# Patient Record
Sex: Male | Born: 1980 | Race: White | Hispanic: No | Marital: Single | Smoking: Current every day smoker
Health system: Southern US, Community
[De-identification: ages and names within clinical notes are randomized; demographics above are authoritative.]

## PROBLEM LIST (undated history)

## (undated) DIAGNOSIS — S069XAA Unspecified intracranial injury with loss of consciousness status unknown, initial encounter: Secondary | ICD-10-CM

## (undated) DIAGNOSIS — S069X9A Unspecified intracranial injury with loss of consciousness of unspecified duration, initial encounter: Secondary | ICD-10-CM

## (undated) DIAGNOSIS — R569 Unspecified convulsions: Secondary | ICD-10-CM

---

## 2011-03-09 ENCOUNTER — Encounter: Payer: Self-pay | Admitting: Student

## 2011-03-09 ENCOUNTER — Emergency Department (HOSPITAL_BASED_OUTPATIENT_CLINIC_OR_DEPARTMENT_OTHER)
Admission: EM | Admit: 2011-03-09 | Discharge: 2011-03-09 | Disposition: A | Discharge status: Home / Self Care | Payer: Medicaid Other | Attending: Emergency Medicine | Admitting: Emergency Medicine

## 2011-03-09 DIAGNOSIS — F17200 Nicotine dependence, unspecified, uncomplicated: Secondary | ICD-10-CM | POA: Insufficient documentation

## 2011-03-09 DIAGNOSIS — R569 Unspecified convulsions: Secondary | ICD-10-CM

## 2011-03-09 DIAGNOSIS — G40909 Epilepsy, unspecified, not intractable, without status epilepticus: Secondary | ICD-10-CM | POA: Insufficient documentation

## 2011-03-09 HISTORY — DX: Unspecified convulsions: R56.9

## 2011-03-09 MED ORDER — PHENYTOIN SODIUM EXTENDED 300 MG PO CAPS: 300.0000 mg | ORAL_CAPSULE | Freq: Every day | ORAL | Status: DC

## 2011-03-09 MED ORDER — PHENYTOIN SODIUM EXTENDED 100 MG PO CAPS: 300.0000 mg | ORAL_CAPSULE | Freq: Every day | ORAL | Status: DC

## 2011-03-09 MED ORDER — TETANUS-DIPHTH-ACELL PERTUSSIS 5-2.5-18.5 LF-MCG/0.5 IM SUSP
0.5000 mL | Freq: Once | INTRAMUSCULAR | Status: AC
  Administered 2011-03-09: 0.5 mL via INTRAMUSCULAR

## 2011-03-09 MED ORDER — PHENYTOIN SODIUM 50 MG/ML IJ SOLN
500.0000 mg | Freq: Once | INTRAMUSCULAR | Status: AC
  Administered 2011-03-09: 500 mg via INTRAVENOUS
  Filled 2011-03-09: qty 10

## 2011-03-09 NOTE — ED Provider Notes (Signed)
History     Chief Complaint  Patient presents with  . Seizures    pt in via EMS with reported and witnessed seizure. Pt off seizure meds x 3 days (dilantin 300 mg)   Patient is a 30 y.o. male presenting with seizures and fall. The history is provided by the patient.  Seizures  This is a chronic problem. The current episode started less than 1 hour ago. The problem has been resolved. There was 1 seizure. Associated symptoms include sleepiness. Characteristics do not include loss of consciousness. The episode was witnessed. There was the sensation of an aura present. The seizures did not continue in the ED. Possible causes include med or dosage change. Possible causes do not include sleep deprivation, recent illness or change in alcohol use. There has been no fever. There were no medications administered prior to arrival.  Fall The accident occurred less than 1 hour ago. He landed on concrete. There was no blood loss. The pain is present in the head. He was not ambulatory at the scene. There was no entrapment after the fall. There was no drug use involved in the accident. There was no alcohol use involved in the accident. Pertinent negatives include no loss of consciousness. Treatment on scene includes a c-collar and a backboard. He has tried nothing for the symptoms.  Pt has abtrasions on fingers of right hand, and abrasion to forehead.  Pt feels fine now.  Pt does not think he has anything broken,  Pt is out of dilantin.  Pt has not been able to get rx filled.  No money to fill RX. Past Medical History  Diagnosis Date  . Seizure     No past surgical history on file.  No family history on file.  History  Substance Use Topics  . Smoking status: Current Everyday Smoker  . Smokeless tobacco: Not on file  . Alcohol Use: No      Review of Systems  Skin: Positive for wound.  Neurological: Positive for seizures. Negative for loss of consciousness.  All other systems reviewed and are  negative.    Physical Exam  Wt 200 lb (90.719 kg)  Physical Exam  Constitutional: He is oriented to person, place, and time. He appears well-developed and well-nourished.  HENT:  Head: Normocephalic.  Eyes: Conjunctivae and EOM are normal.  Neck: Normal range of motion. Neck supple.  Cardiovascular: Normal rate.   Pulmonary/Chest: Effort normal and breath sounds normal.  Abdominal: Soft. Bowel sounds are normal.  Musculoskeletal: Normal range of motion.  Neurological: He is alert and oriented to person, place, and time. He has normal reflexes.  Skin: There is erythema.  Psychiatric: He has a normal mood and affect.  abrasions right hand,  From,  nv and ns intact,  Superficial abrasion forehead no gapping  ED Course  Procedures  MDM Dilatin level 11.1.  Pt given 500mg  IV.  Pt given rx.  7 day supply from [employer]      Langston Masker, [place] 03/09/11 1524

## 2011-03-09 NOTE — ED Notes (Signed)
Mother of patient is taking straps off of LSB , explained that the EDP will need to remove this since the pt needs to be examined prior to removal.

## 2011-03-09 NOTE — ED Notes (Signed)
Witnessed seizure, lasting per family for approx 4 minutes with grand mal followed by petite mal

## 2011-03-11 NOTE — ED Provider Notes (Signed)
Evaluation and management procedures were performed by the PA/NP under my supervision/collaboration.   Dione Booze, MD 03/11/11 [phone]

## 2011-04-03 ENCOUNTER — Emergency Department (HOSPITAL_COMMUNITY)
Admission: EM | Admit: 2011-04-03 | Discharge: 2011-04-03 | Disposition: A | Discharge status: Home / Self Care | Payer: Self-pay | Attending: Emergency Medicine | Admitting: Emergency Medicine

## 2011-04-03 DIAGNOSIS — R404 Transient alteration of awareness: Secondary | ICD-10-CM | POA: Insufficient documentation

## 2011-04-03 DIAGNOSIS — W1809XA Striking against other object with subsequent fall, initial encounter: Secondary | ICD-10-CM | POA: Insufficient documentation

## 2011-04-03 DIAGNOSIS — G40909 Epilepsy, unspecified, not intractable, without status epilepticus: Secondary | ICD-10-CM | POA: Insufficient documentation

## 2011-04-03 DIAGNOSIS — S0180XA Unspecified open wound of other part of head, initial encounter: Secondary | ICD-10-CM | POA: Insufficient documentation

## 2011-04-03 LAB — DIFFERENTIAL
Basophils Absolute: 0 10*3/uL (ref 0.0–0.1)
Basophils Relative: 0 % (ref 0–1)
Eosinophils Absolute: 0.1 10*3/uL (ref 0.0–0.7)
Monocytes Absolute: 0.5 10*3/uL (ref 0.1–1.0)
Monocytes Relative: 6 % (ref 3–12)
Neutrophils Relative %: 72 % (ref 43–77)

## 2011-04-03 LAB — COMPREHENSIVE METABOLIC PANEL
ALT: 18 U/L (ref 0–53)
AST: 18 U/L (ref 0–37)
Albumin: 3.9 g/dL (ref 3.5–5.2)
Calcium: 9.5 mg/dL (ref 8.4–10.5)
Sodium: 141 mEq/L (ref 135–145)
Total Protein: 7.3 g/dL (ref 6.0–8.3)

## 2011-04-03 LAB — CBC
MCH: 30.2 pg (ref 26.0–34.0)
MCHC: 34.3 g/dL (ref 30.0–36.0)
Platelets: 293 10*3/uL (ref 150–400)
RBC: 4.94 MIL/uL (ref 4.22–5.81)

## 2011-04-29 ENCOUNTER — Emergency Department (HOSPITAL_COMMUNITY)
Admission: EM | Admit: 2011-04-29 | Discharge: 2011-04-29 | Disposition: A | Discharge status: Home / Self Care | Payer: Medicaid - Out of State | Attending: Emergency Medicine | Admitting: Emergency Medicine

## 2011-04-29 ENCOUNTER — Emergency Department (HOSPITAL_COMMUNITY): Payer: Medicaid - Out of State

## 2011-04-29 DIAGNOSIS — IMO0002 Reserved for concepts with insufficient information to code with codable children: Secondary | ICD-10-CM | POA: Insufficient documentation

## 2011-04-29 DIAGNOSIS — W19XXXA Unspecified fall, initial encounter: Secondary | ICD-10-CM | POA: Insufficient documentation

## 2011-04-29 DIAGNOSIS — S02609B Fracture of mandible, unspecified, initial encounter for open fracture: Secondary | ICD-10-CM | POA: Insufficient documentation

## 2011-04-29 DIAGNOSIS — F29 Unspecified psychosis not due to a substance or known physiological condition: Secondary | ICD-10-CM | POA: Insufficient documentation

## 2011-04-29 DIAGNOSIS — G40909 Epilepsy, unspecified, not intractable, without status epilepticus: Secondary | ICD-10-CM | POA: Insufficient documentation

## 2011-04-29 LAB — POCT I-STAT, CHEM 8
BUN: 5 mg/dL — ABNORMAL LOW (ref 6–23)
Chloride: 105 mEq/L (ref 96–112)
Creatinine, Ser: 0.7 mg/dL (ref 0.50–1.35)
Glucose, Bld: 95 mg/dL (ref 70–99)
HCT: 48 % (ref 39.0–52.0)
Potassium: 3.9 mEq/L (ref 3.5–5.1)

## 2011-04-29 IMAGING — CT CT HEAD W/O CM
2 series · 17 of 30 positions shown, 20 images · non-contrast
Comparison: None.

CLINICAL DATA: Seizure, confusion.

CT HEAD WITHOUT CONTRAST
TECHNIQUE: Contiguous axial images were obtained from the base of
the skull through the vertex without contrast.

[Series 2: head w/o · axial · non-contrast · 0.43mm/px · z∈[+1190,+1310]mm · 9 of 31 slices shown, 12 images]
[im 4/31  brain]
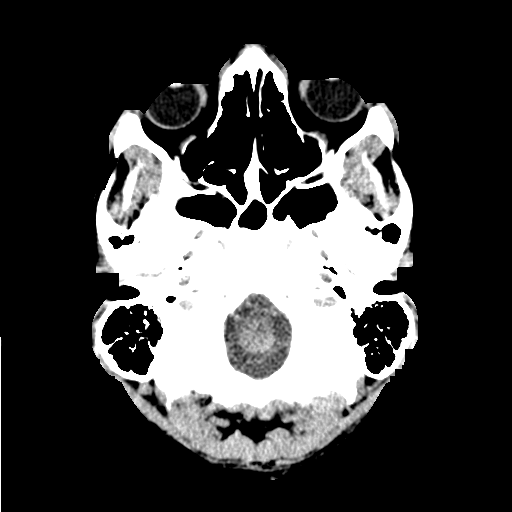
[im 4/31  bone]
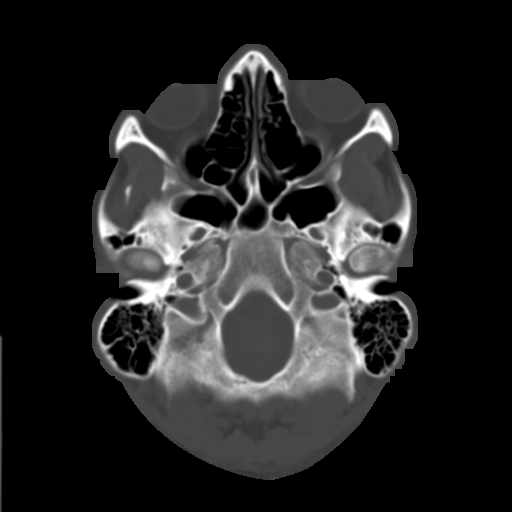
[im 7/31  brain]
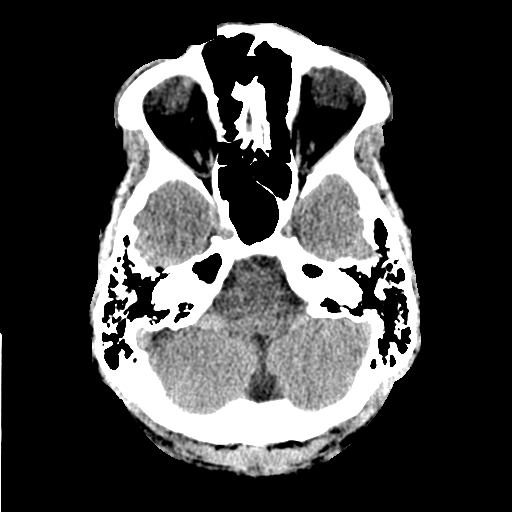
[im 10/31  brain]
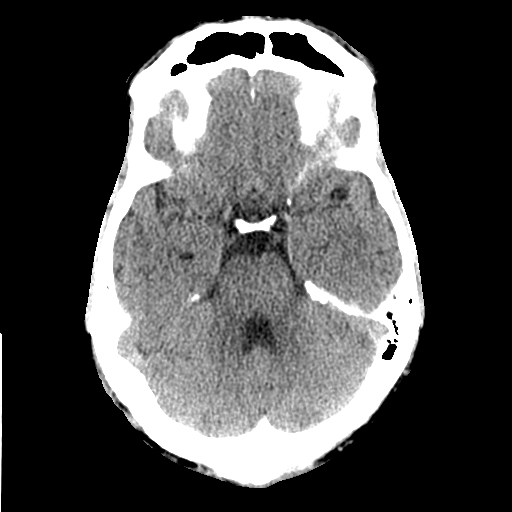
[im 13/31  brain]
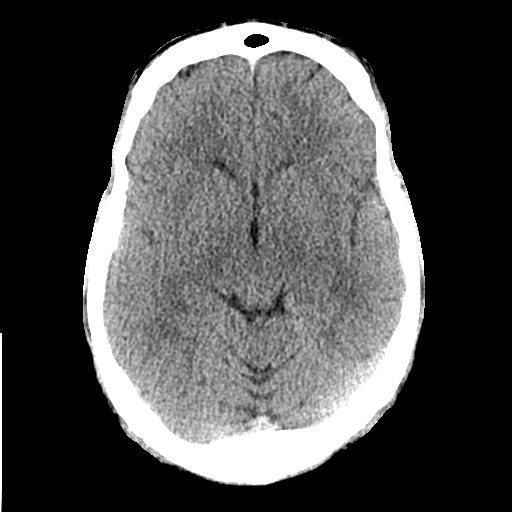
[im 16/31  brain]
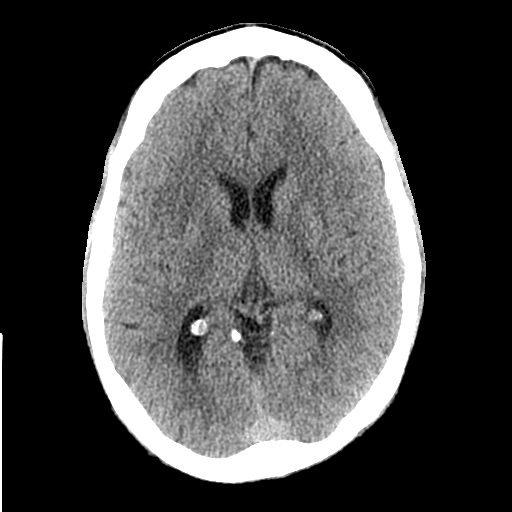
[im 16/31  bone]
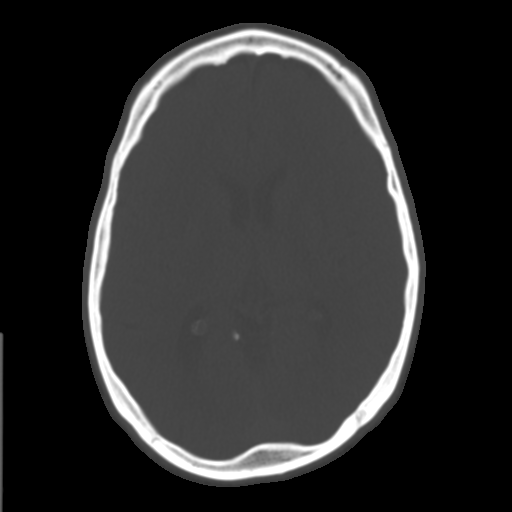
[im 19/31  brain]
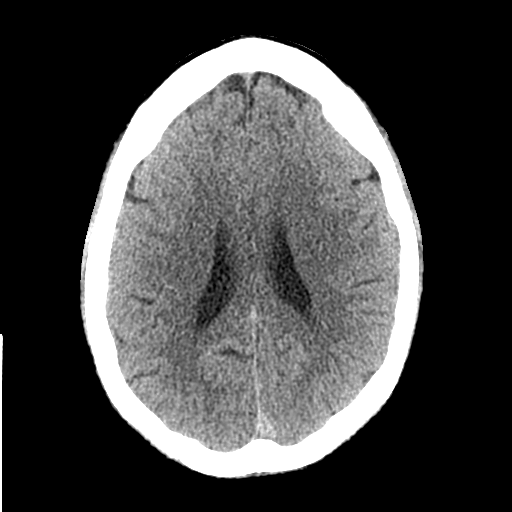
[im 22/31  brain]
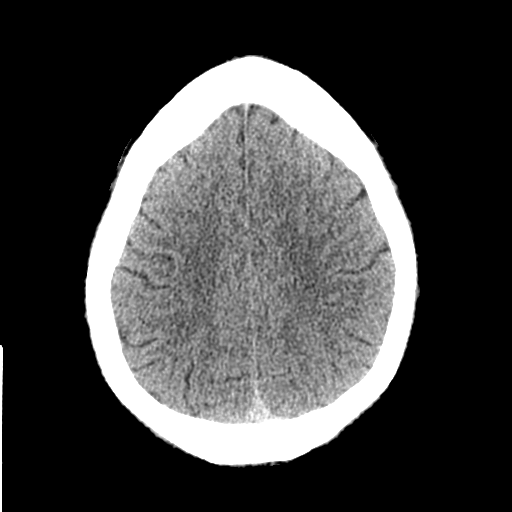
[im 25/31  brain]
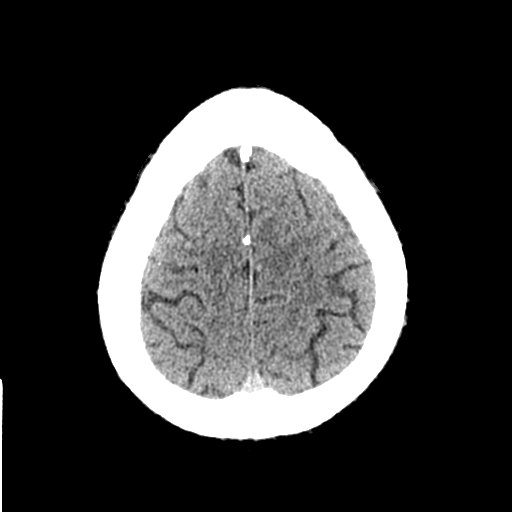
[im 28/31  brain]
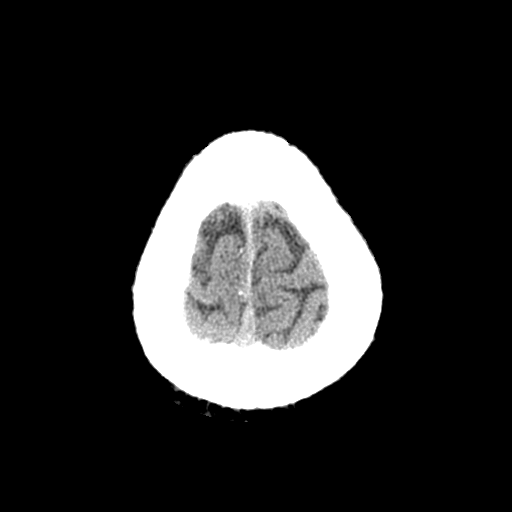
[im 28/31  bone]
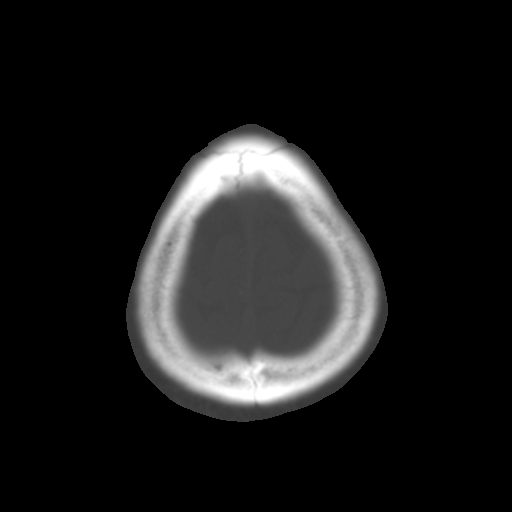

[Series 3: bone windows · axial · 0.43mm/px · z∈[+1190,+1308]mm · 8 of 51 slices shown]
[im 6/51  bone]
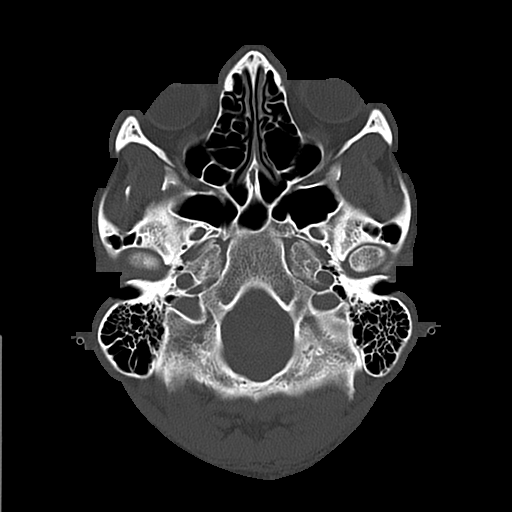
[im 12/51  bone]
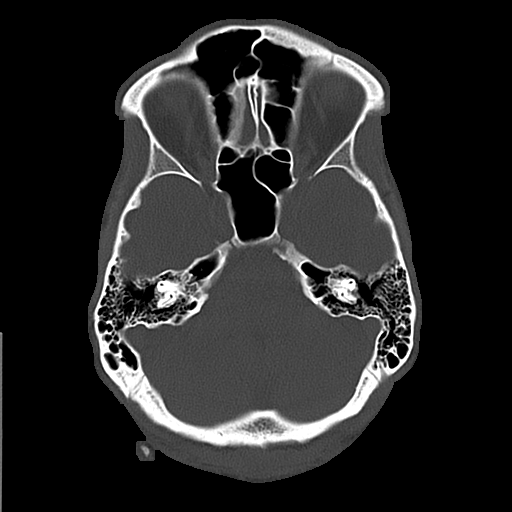
[im 17/51  bone]
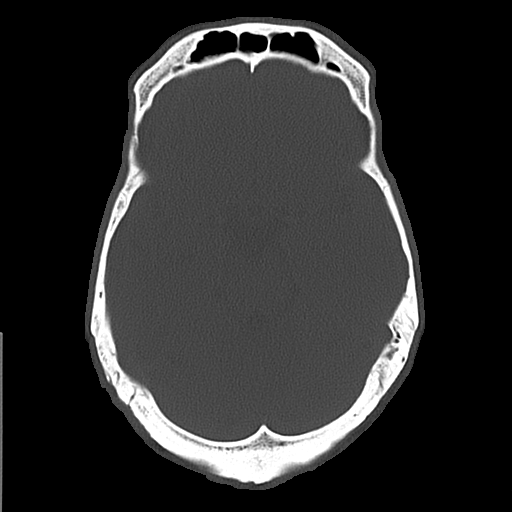
[im 23/51  bone]
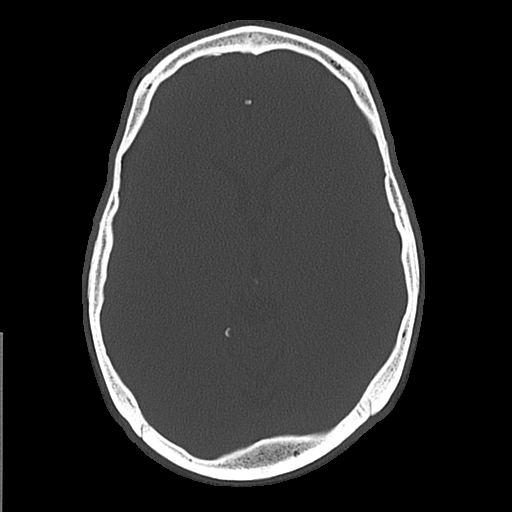
[im 28/51  bone]
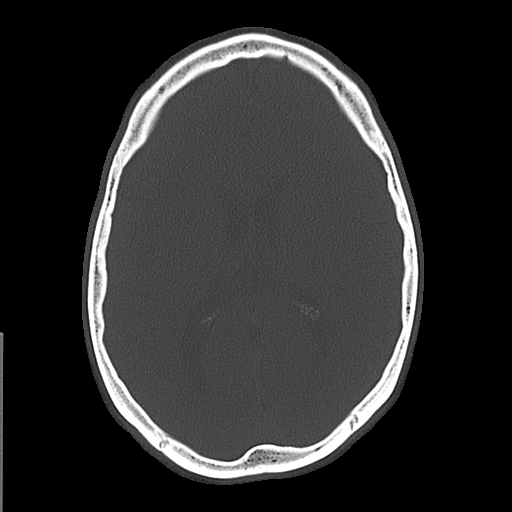
[im 34/51  bone]
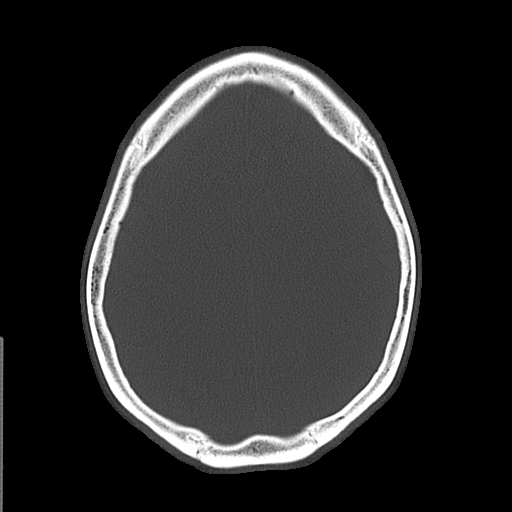
[im 39/51  bone]
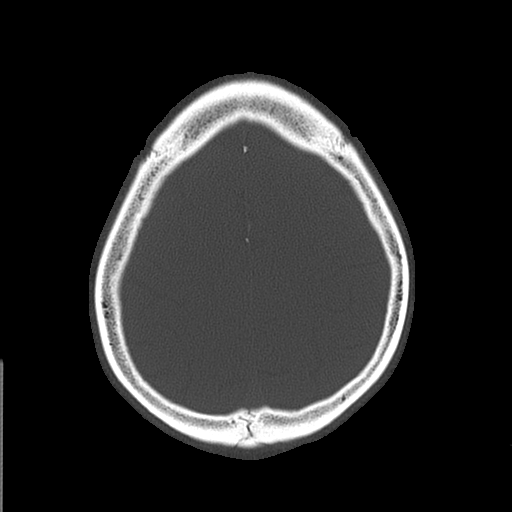
[im 45/51  bone]
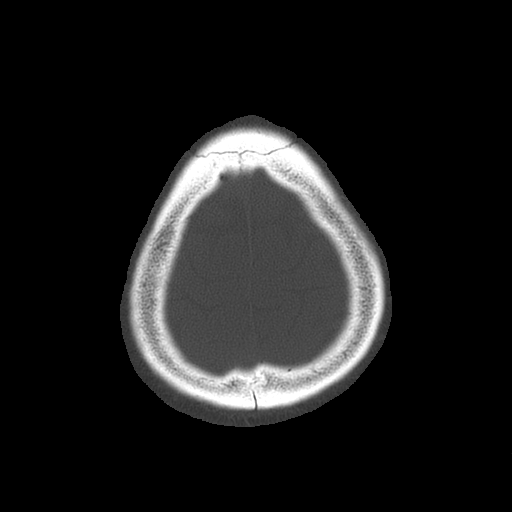

[17 of 30 positions shown; findings below may reference images not displayed]

FINDINGS: The brain appears normal without evidence of acute
infarction, hemorrhage, mass lesion, mass effect, midline shift or
abnormal extra-axial fluid collection.  No pneumocephalus or
hydrocephalus.  Calvarium intact.  There is partial visualization
of a fracture of the right mandibular ramus which is age
indeterminate.
IMPRESSION: 1.  No acute intracranial abnormality.
2.  Age indeterminate right mandibular ramus fracture.  The
fracture is incompletely visualized.

## 2011-06-12 ENCOUNTER — Emergency Department (HOSPITAL_COMMUNITY)
Admission: EM | Admit: 2011-06-12 | Discharge: 2011-06-12 | Disposition: A | Discharge status: Home / Self Care | Payer: Medicaid Other | Attending: Emergency Medicine | Admitting: Emergency Medicine

## 2011-06-12 DIAGNOSIS — G40802 Other epilepsy, not intractable, without status epilepticus: Secondary | ICD-10-CM | POA: Insufficient documentation

## 2011-06-12 LAB — DIFFERENTIAL
Basophils Absolute: 0 10*3/uL (ref 0.0–0.1)
Lymphocytes Relative: 22 % (ref 12–46)
Monocytes Absolute: 0.4 10*3/uL (ref 0.1–1.0)
Monocytes Relative: 6 % (ref 3–12)
Neutro Abs: 4.2 10*3/uL (ref 1.7–7.7)

## 2011-06-12 LAB — BASIC METABOLIC PANEL
BUN: 6 mg/dL (ref 6–23)
GFR calc non Af Amer: >90 mL/min (ref >90)
Glucose, Bld: 86 mg/dL (ref 70–99)
Potassium: 3.9 mEq/L (ref 3.5–5.1)

## 2011-06-12 LAB — CBC
HCT: 42.3 % (ref 39.0–52.0)
Hemoglobin: 14.5 g/dL (ref 13.0–17.0)
MCHC: 34.3 g/dL (ref 30.0–36.0)

## 2011-06-22 ENCOUNTER — Emergency Department (HOSPITAL_COMMUNITY)
Admission: EM | Admit: 2011-06-22 | Discharge: 2011-06-22 | Disposition: A | Discharge status: Home / Self Care | Payer: Medicaid Other | Attending: Emergency Medicine | Admitting: Emergency Medicine

## 2011-06-22 DIAGNOSIS — R296 Repeated falls: Secondary | ICD-10-CM | POA: Insufficient documentation

## 2011-06-22 DIAGNOSIS — IMO0002 Reserved for concepts with insufficient information to code with codable children: Secondary | ICD-10-CM | POA: Insufficient documentation

## 2011-06-22 DIAGNOSIS — G40909 Epilepsy, unspecified, not intractable, without status epilepticus: Secondary | ICD-10-CM | POA: Insufficient documentation

## 2011-06-22 DIAGNOSIS — M549 Dorsalgia, unspecified: Secondary | ICD-10-CM | POA: Insufficient documentation

## 2011-06-22 LAB — BASIC METABOLIC PANEL
BUN: 5 mg/dL — ABNORMAL LOW (ref 6–23)
Chloride: 106 mEq/L (ref 96–112)
GFR calc non Af Amer: >90 mL/min (ref >90)
Glucose, Bld: 83 mg/dL (ref 70–99)
Potassium: 3.9 mEq/L (ref 3.5–5.1)

## 2011-07-16 ENCOUNTER — Emergency Department (HOSPITAL_COMMUNITY)
Admission: EM | Admit: 2011-07-16 | Discharge: 2011-07-16 | Disposition: A | Discharge status: Home / Self Care | Payer: Medicaid Other | Attending: Emergency Medicine | Admitting: Emergency Medicine

## 2011-07-16 ENCOUNTER — Encounter (HOSPITAL_COMMUNITY): Payer: Self-pay | Admitting: Emergency Medicine

## 2011-07-16 DIAGNOSIS — Z87820 Personal history of traumatic brain injury: Secondary | ICD-10-CM | POA: Insufficient documentation

## 2011-07-16 DIAGNOSIS — Z79899 Other long term (current) drug therapy: Secondary | ICD-10-CM | POA: Insufficient documentation

## 2011-07-16 DIAGNOSIS — F17200 Nicotine dependence, unspecified, uncomplicated: Secondary | ICD-10-CM | POA: Insufficient documentation

## 2011-07-16 DIAGNOSIS — G40909 Epilepsy, unspecified, not intractable, without status epilepticus: Secondary | ICD-10-CM | POA: Insufficient documentation

## 2011-07-16 HISTORY — DX: Unspecified intracranial injury with loss of consciousness status unknown, initial encounter: S06.9XAA

## 2011-07-16 HISTORY — DX: Unspecified intracranial injury with loss of consciousness of unspecified duration, initial encounter: S06.9X9A

## 2011-07-16 LAB — DIFFERENTIAL
Lymphocytes Relative: 17 % (ref 12–46)
Monocytes Absolute: 0.5 10*3/uL (ref 0.1–1.0)
Monocytes Relative: 7 % (ref 3–12)
Neutro Abs: 5.5 10*3/uL (ref 1.7–7.7)

## 2011-07-16 LAB — CBC
HCT: 43 % (ref 39.0–52.0)
Hemoglobin: 14.5 g/dL (ref 13.0–17.0)
RBC: 5.03 MIL/uL (ref 4.22–5.81)
WBC: 7.2 10*3/uL (ref 4.0–10.5)

## 2011-07-16 LAB — PHENYTOIN LEVEL, TOTAL: Phenytoin Lvl: 4.1 ug/mL — ABNORMAL LOW (ref 10.0–20.0)

## 2011-07-16 LAB — POCT I-STAT, CHEM 8
BUN: 4 mg/dL — ABNORMAL LOW (ref 6–23)
Calcium, Ion: 1.18 mmol/L (ref 1.12–1.32)
Chloride: 106 mEq/L (ref 96–112)
Glucose, Bld: 86 mg/dL (ref 70–99)

## 2011-07-16 MED ORDER — PHENYTOIN SODIUM EXTENDED 100 MG PO CAPS: 300.0000 mg | ORAL_CAPSULE | Freq: Every day | ORAL | Status: DC

## 2011-07-16 MED ORDER — LORAZEPAM 1 MG PO TABS: 1.0000 mg | ORAL_TABLET | Freq: Three times a day (TID) | ORAL | Status: AC | PRN

## 2011-07-16 MED ORDER — LORAZEPAM 1 MG PO TABS
1.0000 mg | ORAL_TABLET | Freq: Once | ORAL | Status: AC
  Administered 2011-07-16: 1 mg via ORAL
  Filled 2011-07-16: qty 1

## 2011-07-16 MED ORDER — PHENYTOIN SODIUM EXTENDED 100 MG PO CAPS
1000.0000 mg | ORAL_CAPSULE | Freq: Once | ORAL | Status: AC
  Administered 2011-07-16: 1000 mg via ORAL
  Filled 2011-07-16: qty 10

## 2011-07-16 NOTE — ED Provider Notes (Signed)
History     CSN: [id number] Arrival date & time: 07/16/2011 12:26 PM   First MD Initiated Contact with Patient 07/16/11 1240      Chief Complaint  Patient presents with  . Seizures    (Consider location/radiation/quality/duration/timing/severity/associated sxs/prior treatment) Patient is a 30 y.o. male presenting with seizures. The history is provided by the patient and a parent.  Seizures  The current episode started 1 to 2 hours ago. The problem has been resolved. There was 1 seizure. The most recent episode lasted more than 5 minutes. Pertinent negatives include no headaches, no speech difficulty, no chest pain, no nausea and no vomiting. Characteristics include rhythmic jerking. Characteristics do not include bowel incontinence, bladder incontinence or bit tongue. The episode was witnessed. The seizures did not continue in the ED. Possible causes include missed seizure meds.  Patient has hx of seizure d/o and is on Dilantin 100mg  TID for this. He has had a problem with his Medicaid coverage and ran out of his Dilantin yesterday; he only had one tab yesterday AM and none since. Mom reports that he seized in the bathroom this AM, for about 10 minutes. She doesn't believe that he hit his head on anything. He was postictal but seemed to "come around" more quickly than he has with past seizures. She states that the 10 minutes of seizure was shorter than typical. Patient denies etoh, drug use.   Past Medical History  Diagnosis Date  . Seizure   . Traumatic brain injury     History reviewed. No pertinent past surgical history.  No family history on file.  History  Substance Use Topics  . Smoking status: Current Everyday Smoker  . Smokeless tobacco: Not on file  . Alcohol Use: No      Review of Systems  Constitutional: Negative for fever, activity change and appetite change.  HENT: Negative.   Eyes: Negative.   Respiratory: Negative for shortness of breath.   Cardiovascular:  Negative for chest pain and palpitations.  Gastrointestinal: Negative for nausea, vomiting and bowel incontinence.  Genitourinary: Negative for bladder incontinence.  Musculoskeletal: Negative for myalgias.  Skin: Negative for color change and wound.  Neurological: Positive for seizures. Negative for dizziness, speech difficulty, weakness, light-headedness and headaches.    Allergies  Review of patient's allergies indicates no known allergies.  Home Medications   Current Outpatient Rx  Name Route Sig Dispense Refill  . PHENYTOIN SODIUM EXTENDED 300 MG PO CAPS Oral Take 1 capsule (300 mg total) by mouth daily. 30 capsule 0    BP 129/59  Pulse 93  Temp(Src) 98.4 F (36.9 C) (Oral)  Resp 18  Ht 6\' 1"  (1.854 m)  Wt 180 lb (81.647 kg)  BMI 23.75 kg/m2  SpO2 100%  Physical Exam  Nursing note and vitals reviewed. Constitutional: He is oriented to person, place, and time. He appears well-developed and well-nourished. No distress.       He is presently sleepy but A+O and answers questions appropriately.   HENT:  Head: Normocephalic and atraumatic.  Right Ear: External ear normal.  Left Ear: External ear normal.  Mouth/Throat: Oropharynx is clear and moist. No oropharyngeal exudate.       No oropharyngeal trauma/evidence of tongue laceration  Eyes: Conjunctivae are normal. Pupils are equal, round, and reactive to light.  Neck: Normal range of motion. Neck supple.  Cardiovascular: Normal rate, regular rhythm and normal heart sounds.   Pulmonary/Chest: Effort normal and breath sounds normal.  Abdominal: Soft. Bowel sounds are  normal. There is no tenderness.  Musculoskeletal: Normal range of motion. He exhibits no tenderness.       Spine: no palp stepoff, crepitus, deformity  Neurological: He is alert and oriented to person, place, and time. He has normal reflexes. No cranial nerve deficit. He exhibits normal muscle tone. Coordination normal.  Skin: Skin is warm and dry. He is not  diaphoretic.  Psychiatric: He has a normal mood and affect.    ED Course  Procedures (including critical care time)  Labs Reviewed  PHENYTOIN LEVEL, TOTAL - Abnormal; Notable for the following:    Phenytoin Lvl 4.1 (*)    All other components within normal limits  POCT I-STAT, CHEM 8 - Abnormal; Notable for the following:    BUN 4 (*)    All other components within normal limits  CBC  DIFFERENTIAL   No results found.   1. Epilepsy       MDM  Patient was repleted with PO Dilantin in the ED and given instructions on re-loading. I wrote him a prescription with refills until he is able to be seen by his primary care doctor in January. He was additionally given a prescription for Ativan, and was instructed to take this today and tomorrow as his oral repletion takes effect. He was instructed on reasons to return to the ED. Patient and mom verbalized understanding and agreed to plan.        Grant Fontana, [place] 07/16/11 1942

## 2011-07-16 NOTE — ED Provider Notes (Signed)
Medical screening examination/treatment/procedure(s) were performed by non-physician practitioner and as supervising physician I was immediately available for consultation/collaboration.   Lyanne Co, MD 07/16/11 2204

## 2011-07-16 NOTE — ED Notes (Signed)
Mother reports pt had seizure this am. States pt normally takes Dilantin 100mg  3xday but ran out yesterday. Pt A&Ox3 upon arrival to room.

## 2011-07-16 NOTE — ED Notes (Signed)
Pt remains A&Ox3. Drowsy. No complains of pain or discomfort. Mother at bedside. Awaiting md recheck.

## 2011-11-04 ENCOUNTER — Encounter (HOSPITAL_COMMUNITY): Payer: Self-pay | Admitting: *Deleted

## 2011-11-04 ENCOUNTER — Emergency Department (HOSPITAL_COMMUNITY)
Admission: EM | Admit: 2011-11-04 | Discharge: 2011-11-04 | Disposition: A | Discharge status: Home / Self Care | Payer: Medicaid Other | Attending: Emergency Medicine | Admitting: Emergency Medicine

## 2011-11-04 DIAGNOSIS — Z9119 Patient's noncompliance with other medical treatment and regimen: Secondary | ICD-10-CM | POA: Insufficient documentation

## 2011-11-04 DIAGNOSIS — Z91199 Patient's noncompliance with other medical treatment and regimen due to unspecified reason: Secondary | ICD-10-CM | POA: Insufficient documentation

## 2011-11-04 DIAGNOSIS — R569 Unspecified convulsions: Secondary | ICD-10-CM

## 2011-11-04 DIAGNOSIS — G40909 Epilepsy, unspecified, not intractable, without status epilepticus: Secondary | ICD-10-CM | POA: Insufficient documentation

## 2011-11-04 DIAGNOSIS — Z9114 Patient's other noncompliance with medication regimen: Secondary | ICD-10-CM

## 2011-11-04 DIAGNOSIS — F29 Unspecified psychosis not due to a substance or known physiological condition: Secondary | ICD-10-CM | POA: Insufficient documentation

## 2011-11-04 LAB — RAPID URINE DRUG SCREEN, HOSP PERFORMED
Amphetamines: NOT DETECTED
Benzodiazepines: NOT DETECTED
Cocaine: NOT DETECTED
Opiates: NOT DETECTED

## 2011-11-04 LAB — DIFFERENTIAL
Basophils Relative: 0 % (ref 0–1)
Lymphs Abs: 1.4 10*3/uL (ref 0.7–4.0)
Monocytes Absolute: 0.6 10*3/uL (ref 0.1–1.0)
Monocytes Relative: 6 % (ref 3–12)
Neutro Abs: 7.1 10*3/uL (ref 1.7–7.7)

## 2011-11-04 LAB — POCT I-STAT, CHEM 8
Calcium, Ion: 1.21 mmol/L (ref 1.12–1.32)
Creatinine, Ser: 0.8 mg/dL (ref 0.50–1.35)
Glucose, Bld: 93 mg/dL (ref 70–99)
Hemoglobin: 17 g/dL (ref 13.0–17.0)
Sodium: 142 mEq/L (ref 135–145)
TCO2: 24 mmol/L (ref 0–100)

## 2011-11-04 LAB — PHENYTOIN LEVEL, TOTAL: Phenytoin Lvl: 7.3 ug/mL — ABNORMAL LOW (ref 10.0–20.0)

## 2011-11-04 LAB — CBC
HCT: 46.6 % (ref 39.0–52.0)
Hemoglobin: 16 g/dL (ref 13.0–17.0)
MCHC: 34.3 g/dL (ref 30.0–36.0)

## 2011-11-04 MED ORDER — SODIUM CHLORIDE 0.9 % IV SOLN
1000.0000 mg | Freq: Once | INTRAVENOUS | Status: AC
  Administered 2011-11-04: 1000 mg via INTRAVENOUS
  Filled 2011-11-04: qty 20

## 2011-11-04 MED ORDER — SODIUM CHLORIDE 0.9 % IV SOLN: Freq: Once | INTRAVENOUS | Status: DC

## 2011-11-04 MED ORDER — PHENYTOIN SODIUM EXTENDED 100 MG PO CAPS: 300.0000 mg | ORAL_CAPSULE | Freq: Three times a day (TID) | ORAL | Status: AC

## 2011-11-04 NOTE — Discharge Instructions (Signed)
Epilepsy  A seizure (convulsion) is a sudden change in brain function that causes a change in behavior, muscle activity, or ability to remain awake and alert. If a person has recurring seizures, this is called epilepsy.  CAUSES   Epilepsy is a disorder with many possible causes. Anything that disturbs the normal pattern of brain cell activity can lead to seizures. Seizure can be caused from illness to brain damage to abnormal brain development. Epilepsy may develop because of:   An abnormality in brain wiring.   An imbalance of nerve signaling chemicals (neurotransmitters).   Some combination of these factors.  Scientists are learning an increasing amount about genetic causes of seizures.  SYMPTOMS   The symptoms of a seizure can vary greatly from one person to another. These may include:   An aura, or warning that tells a person they are about to have a seizure.   Abnormal sensations, such as abnormal smell or seeing flashing lights.   Sudden, general body stiffness.   Rhythmic jerking of the face, arm, or leg - on one or both sides.   Sudden change in consciousness.   The person may appear to be awake but not responding.   They may appear to be asleep but cannot be awakened.   Grimacing, chewing, lip smacking, or drooling.   Often there is a period of sleepiness after a seizure.  DIAGNOSIS   The description you give to your caregiver about what you experienced will help them understand your problems. Equally important is the description by any witnesses to your seizure. A physical exam, including a detailed neurological exam, is necessary. An EEG (electroencephalogram) is a painless test of your brain waves. In this test a diagram is created of your brain waves. These diagrams can be interpreted by a specialist. Pictures of your brain are usually taken with:   An MRI.   A CT scan.  Lab tests may be done to look for:   Signs of infection.   Abnormal blood chemistry.  PREVENTION   There is no way to  prevent the development of epilepsy. If you have seizures that are typically triggered by an event (such as flashing lights), try to avoid the trigger. This can help you avoid a seizure.   PROGNOSIS   Most people with epilepsy lead outwardly normal lives. While epilepsy cannot currently be cured, for some people it does eventually go away. Most seizures do not cause brain damage. It is not uncommon for people with epilepsy, especially children, to develop behavioral and emotional problems. These problems are sometimes the consequence of medicine for seizures or social stress. For some people with epilepsy, the risk of seizures restricts their independence and recreational activities. For example, some states refuse drivers licenses to people with epilepsy.  Most women with epilepsy can become pregnant. They should discuss their epilepsy and the medicine they are taking with their caregivers. Women with epilepsy have a 90 percent or better chance of having a normal, healthy baby.  RISKS AND COMPLICATIONS   People with epilepsy are at increased risk of falls, accidents, and injuries. People with epilepsy are at special risk for two life-threatening conditions. These are status epilepticus and sudden unexplained death (extremely rare). Status epilepticus is a long lasting, continuous seizure that is a medical emergency.  TREATMENT   Once epilepsy is diagnosed, it is important to begin treatment as soon as possible. For about 80 percent of those diagnosed with epilepsy, seizures can be controlled with modern medicines   and surgical techniques. Some antiepileptic drugs can interfere with the effectiveness of oral contraceptives. In 1997, the FDA approved a pacemaker for the brain the (vagus nerve stimulator). This stimulator can be used for people with seizures that are not well-controlled by medicine. Studies have shown that in some cases, children may experience fewer seizures if they maintain a strict diet. The strict  diet is called the ketogenic diet. This diet is rich in fats and low in carbohydrates.  HOME CARE INSTRUCTIONS    Your caregiver will make recommendations about driving and safety in normal activities. Follow these carefully.   Take any medicine prescribed exactly as directed.   Do any blood tests requested to monitor the levels of your medicine.   The people you live and work with should know that you are prone to seizures. They should receive instructions on how to help you. In general, a witness to a seizure should:   Cushion your head and body.   Turn you on your side.   Avoid unnecessarily restraining you.   Not place anything inside your mouth.   Call for local emergency medical help if there is any question about what has occurred.   Keep a seizure diary. Record what you recall about any seizure, especially any possible trigger.   If your caregiver has given you a follow-up appointment, it is very important to keep that appointment. Not keeping the appointment could result in permanent injury and disability. If there is any problem keeping the appointment, you must call back to this facility for assistance.  SEEK MEDICAL CARE IF:    You develop signs of infection or other illness. This might increase the risk of a seizure.   You seem to be having more frequent seizures.   Your seizure pattern is changing.  SEEK IMMEDIATE MEDICAL CARE IF:    A seizure does not stop after a few moments.   A seizure causes any difficulty in breathing.   A seizure results in a very severe headache.   A seizure leaves you with the inability to speak or use a part of your body.  MAKE SURE YOU:    Understand these instructions.   Will watch your condition.   Will get help right away if you are not doing well or get worse.  Document Released: 08/09/2005 Document Revised: 07/29/2011 Document Reviewed: 03/15/2008  [employer] Patient Information 2012 [employer].
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
## 2011-11-04 NOTE — ED Notes (Signed)
Notified pt that we need urine sample.  St's he doesn't have to go; instructed pt to alert staff when he needs to go.

## 2011-11-04 NOTE — ED Provider Notes (Signed)
Medical screening examination/treatment/procedure(s) were performed by non-physician practitioner and as supervising physician I was immediately available for consultation/collaboration.   Hanley Seamen, MD 11/04/11 [phone]

## 2011-11-04 NOTE — ED Notes (Signed)
[id number]<AV> Expected date:11/04/11<BR> Expected time:12:51 AM<BR> Means of arrival:Ambulance<BR> Comments:<BR> Seizure, out of meds

## 2011-11-04 NOTE — ED Notes (Signed)
Per EMS:  Pt had a seizure that lasted about 3-4 minutes, pt is supposed to be on dilantin and hasn't taken it in 3 days.  Pt is post-ictal.  Alert and oriented.  Did not hit his head

## 2011-11-10 LAB — PHENYTOIN LEVEL, FREE AND TOTAL: Phenytoin Bound: 6 mg/L

## 2011-11-28 ENCOUNTER — Emergency Department (HOSPITAL_COMMUNITY)
Admission: EM | Admit: 2011-11-28 | Discharge: 2011-11-28 | Disposition: A | Discharge status: Home / Self Care | Payer: Medicaid Other | Attending: Emergency Medicine | Admitting: Emergency Medicine

## 2011-11-28 ENCOUNTER — Other Ambulatory Visit: Payer: Self-pay

## 2011-11-28 DIAGNOSIS — G40919 Epilepsy, unspecified, intractable, without status epilepticus: Secondary | ICD-10-CM

## 2011-11-28 DIAGNOSIS — Z87820 Personal history of traumatic brain injury: Secondary | ICD-10-CM | POA: Insufficient documentation

## 2011-11-28 DIAGNOSIS — R55 Syncope and collapse: Secondary | ICD-10-CM | POA: Insufficient documentation

## 2011-11-28 LAB — DIFFERENTIAL
Basophils Relative: 0 % (ref 0–1)
Eosinophils Absolute: 0 10*3/uL (ref 0.0–0.7)
Eosinophils Relative: 1 % (ref 0–5)
Neutrophils Relative %: 70 % (ref 43–77)

## 2011-11-28 LAB — CBC
MCH: 28.8 pg (ref 26.0–34.0)
MCHC: 32.8 g/dL (ref 30.0–36.0)
MCV: 87.7 fL (ref 78.0–100.0)
Platelets: 286 10*3/uL (ref 150–400)
RDW: 14.8 % (ref 11.5–15.5)

## 2011-11-28 LAB — BASIC METABOLIC PANEL
Calcium: 9.6 mg/dL (ref 8.4–10.5)
GFR calc Af Amer: >90 mL/min (ref >90)
GFR calc non Af Amer: >90 mL/min (ref >90)
Potassium: 3.6 mEq/L (ref 3.5–5.1)
Sodium: 140 mEq/L (ref 135–145)

## 2011-11-28 MED ORDER — SODIUM CHLORIDE 0.9 % IV BOLUS (SEPSIS)
1000.0000 mL | Freq: Once | INTRAVENOUS | Status: AC
  Administered 2011-11-28: 1000 mL via INTRAVENOUS
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
## 2011-11-28 NOTE — ED Provider Notes (Signed)
History     CSN: [id number]  Arrival date & time 11/28/11  1416   First MD Initiated Contact with Patient 11/28/11 1501      Chief Complaint  Patient presents with  . Seizures    (Consider location/radiation/quality/duration/timing/severity/associated sxs/prior treatment) HPI Pt seen here for same complaint in March. States he has been compliant with dilantin. Had witnessed tonic clonic seizure today lasting 1-2 min, +post-ictal period after. No head or neck trauma. No incontinence, or tongue biting. Has appointment to see neurologist this month.  Past Medical History  Diagnosis Date  . Seizure   . Traumatic brain injury     No past surgical history on file.  No family history on file.  History  Substance Use Topics  . Smoking status: Current Everyday Smoker  . Smokeless tobacco: Not on file  . Alcohol Use: No      Review of Systems  Constitutional: Negative for fever and chills.  Respiratory: Negative for shortness of breath.   Cardiovascular: Negative for chest pain.  Gastrointestinal: Negative for nausea, vomiting and abdominal pain.  Musculoskeletal: Negative for back pain.  Skin: Negative for wound.  Neurological: Positive for seizures and syncope. Negative for dizziness, weakness and headaches.    Allergies  Review of patient's allergies indicates no known allergies.  Home Medications   Current Outpatient Rx  Name Route Sig Dispense Refill  . PHENYTOIN SODIUM EXTENDED 100 MG PO CAPS Oral Take 3 capsules (300 mg total) by mouth 3 (three) times daily. 120 capsule 3    BP 103/55  Pulse 72  Temp(Src) 98.1 F (36.7 C) (Oral)  Resp 18  SpO2 100%  Physical Exam  Nursing note and vitals reviewed. Constitutional: He is oriented to person, place, and time. He appears well-developed and well-nourished. No distress.       mildly drowsy but follow commands and answers questions  HENT:  Head: Normocephalic and atraumatic.  Mouth/Throat: Oropharynx is clear  and moist.  Eyes: EOM are normal. Pupils are equal, round, and reactive to light.  Neck: Normal range of motion. Neck supple.       No post midline cervical TTP  Cardiovascular: Normal rate and regular rhythm.   Pulmonary/Chest: Effort normal and breath sounds normal. No respiratory distress. He has no wheezes. He has no rales.  Abdominal: Soft. Bowel sounds are normal. There is no tenderness. There is no rebound and no guarding.  Musculoskeletal: Normal range of motion. He exhibits no edema and no tenderness.  Neurological: He is alert and oriented to person, place, and time.       5/5 motor in all ext, sensation intact  Skin: Skin is warm and dry. No rash noted. No erythema.  Psychiatric: He has a normal mood and affect. His behavior is normal.    ED Course  Procedures (including critical care time)  Labs Reviewed  BASIC METABOLIC PANEL - Abnormal; Notable for the following:    BUN 5 (*)    All other components within normal limits  CBC  DIFFERENTIAL  PHENYTOIN LEVEL, TOTAL   No results found.   1. Breakthrough seizure       MDM  Phenytoin level WNL. F/U with neurology for med adjustment. No driving until cleared by neurology        Loren Racer, MD 11/28/11 [phone]

## 2011-11-28 NOTE — ED Notes (Signed)
[id number]<NW> Expected date:<BR> Expected time: 2:10 PM<BR> Means of arrival:<BR> Comments:<BR> M11 - 30yoM Seizure with hx

## 2011-11-28 NOTE — ED Notes (Signed)
Mother at bedside.

## 2011-11-28 NOTE — Discharge Instructions (Signed)
Follow up with your Neurologist. No driving. Return for any concerns  Epilepsy A seizure (convulsion) is a sudden change in brain function that causes a change in behavior, muscle activity, or ability to remain awake and alert. If a person has recurring seizures, this is called epilepsy. CAUSES  Epilepsy is a disorder with many possible causes. Anything that disturbs the normal pattern of brain cell activity can lead to seizures. Seizure can be caused from illness to brain damage to abnormal brain development. Epilepsy may develop because of:  An abnormality in brain wiring.   An imbalance of nerve signaling chemicals (neurotransmitters).   Some combination of these factors.  Scientists are learning an increasing amount about genetic causes of seizures. SYMPTOMS  The symptoms of a seizure can vary greatly from one person to another. These may include:  An aura, or warning that tells a person they are about to have a seizure.   Abnormal sensations, such as abnormal smell or seeing flashing lights.   Sudden, general body stiffness.   Rhythmic jerking of the face, arm, or leg - on one or both sides.   Sudden change in consciousness.   The person may appear to be awake but not responding.   They may appear to be asleep but cannot be awakened.   Grimacing, chewing, lip smacking, or drooling.   Often there is a period of sleepiness after a seizure.  DIAGNOSIS  The description you give to your caregiver about what you experienced will help them understand your problems. Equally important is the description by any witnesses to your seizure. A physical exam, including a detailed neurological exam, is necessary. An EEG (electroencephalogram) is a painless test of your brain waves. In this test a diagram is created of your brain waves. These diagrams can be interpreted by a specialist. Pictures of your brain are usually taken with:  An MRI.   A CT scan.  Lab tests may be done to look  for:  Signs of infection.   Abnormal blood chemistry.  PREVENTION  There is no way to prevent the development of epilepsy. If you have seizures that are typically triggered by an event (such as flashing lights), try to avoid the trigger. This can help you avoid a seizure.  PROGNOSIS  Most people with epilepsy lead outwardly normal lives. While epilepsy cannot currently be cured, for some people it does eventually go away. Most seizures do not cause brain damage. It is not uncommon for people with epilepsy, especially children, to develop behavioral and emotional problems. These problems are sometimes the consequence of medicine for seizures or social stress. For some people with epilepsy, the risk of seizures restricts their independence and recreational activities. For example, some states refuse drivers licenses to people with epilepsy. Most women with epilepsy can become pregnant. They should discuss their epilepsy and the medicine they are taking with their caregivers. Women with epilepsy have a 90 percent or better chance of having a normal, healthy baby. RISKS AND COMPLICATIONS  People with epilepsy are at increased risk of falls, accidents, and injuries. People with epilepsy are at special risk for two life-threatening conditions. These are status epilepticus and sudden unexplained death (extremely rare). Status epilepticus is a long lasting, continuous seizure that is a medical emergency. TREATMENT  Once epilepsy is diagnosed, it is important to begin treatment as soon as possible. For about 80 percent of those diagnosed with epilepsy, seizures can be controlled with modern medicines and surgical techniques. Some antiepileptic drugs  can interfere with the effectiveness of oral contraceptives. In 1997, the FDA approved a pacemaker for the brain the (vagus nerve stimulator). This stimulator can be used for people with seizures that are not well-controlled by medicine. Studies have shown that in  some cases, children may experience fewer seizures if they maintain a strict diet. The strict diet is called the ketogenic diet. This diet is rich in fats and low in carbohydrates. HOME CARE INSTRUCTIONS   Your caregiver will make recommendations about driving and safety in normal activities. Follow these carefully.   Take any medicine prescribed exactly as directed.   Do any blood tests requested to monitor the levels of your medicine.   The people you live and work with should know that you are prone to seizures. They should receive instructions on how to help you. In general, a witness to a seizure should:   Cushion your head and body.   Turn you on your side.   Avoid unnecessarily restraining you.   Not place anything inside your mouth.   Call for local emergency medical help if there is any question about what has occurred.   Keep a seizure diary. Record what you recall about any seizure, especially any possible trigger.   If your caregiver has given you a follow-up appointment, it is very important to keep that appointment. Not keeping the appointment could result in permanent injury and disability. If there is any problem keeping the appointment, you must call back to this facility for assistance.  SEEK MEDICAL CARE IF:   You develop signs of infection or other illness. This might increase the risk of a seizure.   You seem to be having more frequent seizures.   Your seizure pattern is changing.  SEEK IMMEDIATE MEDICAL CARE IF:   A seizure does not stop after a few moments.   A seizure causes any difficulty in breathing.   A seizure results in a very severe headache.   A seizure leaves you with the inability to speak or use a part of your body.  MAKE SURE YOU:   Understand these instructions.   Will watch your condition.   Will get help right away if you are not doing well or get worse.  Document Released: 08/09/2005 Document Revised: 07/29/2011 Document Reviewed:  03/15/2008 [facility] Patient Information 2012 [place], [place].

## 2015-06-28 ENCOUNTER — Emergency Department (HOSPITAL_COMMUNITY)
Admission: EM | Admit: 2015-06-28 | Discharge: 2015-06-28 | Disposition: A | Discharge status: Home / Self Care | Payer: Medicaid Other | Attending: Emergency Medicine | Admitting: Emergency Medicine

## 2015-06-28 ENCOUNTER — Encounter (HOSPITAL_COMMUNITY): Payer: Self-pay | Admitting: Emergency Medicine

## 2015-06-28 DIAGNOSIS — Z79899 Other long term (current) drug therapy: Secondary | ICD-10-CM | POA: Insufficient documentation

## 2015-06-28 DIAGNOSIS — K029 Dental caries, unspecified: Secondary | ICD-10-CM | POA: Insufficient documentation

## 2015-06-28 DIAGNOSIS — Z87820 Personal history of traumatic brain injury: Secondary | ICD-10-CM | POA: Insufficient documentation

## 2015-06-28 DIAGNOSIS — K002 Abnormalities of size and form of teeth: Secondary | ICD-10-CM | POA: Insufficient documentation

## 2015-06-28 DIAGNOSIS — Z720 Tobacco use: Secondary | ICD-10-CM | POA: Insufficient documentation

## 2015-06-28 DIAGNOSIS — G40909 Epilepsy, unspecified, not intractable, without status epilepticus: Secondary | ICD-10-CM | POA: Insufficient documentation

## 2015-06-28 MED ORDER — DIVALPROEX SODIUM ER 500 MG PO TB24: 500.0000 mg | ORAL_TABLET | Freq: Every day | ORAL | Status: AC

## 2015-06-28 MED ORDER — CLINDAMYCIN HCL 300 MG PO CAPS: 300.0000 mg | ORAL_CAPSULE | Freq: Four times a day (QID) | ORAL | Status: AC

## 2015-06-28 MED ORDER — BENZOCAINE 10 % MT GEL
Freq: Two times a day (BID) | OROMUCOSAL | Status: DC | PRN
  Administered 2015-06-28: 1 via OROMUCOSAL
  Filled 2015-06-28: qty 9.4

## 2015-06-28 MED ORDER — CLINDAMYCIN HCL 300 MG PO CAPS
300.0000 mg | ORAL_CAPSULE | Freq: Once | ORAL | Status: AC
Start: 2015-06-28 — End: 2015-06-28
  Administered 2015-06-28: 300 mg via ORAL
  Filled 2015-06-28: qty 1

## 2015-06-28 MED ORDER — NAPROXEN 500 MG PO TABS
500.0000 mg | ORAL_TABLET | Freq: Once | ORAL | Status: AC
  Administered 2015-06-28: 500 mg via ORAL
  Filled 2015-06-28: qty 1

## 2015-06-28 MED ORDER — NAPROXEN 375 MG PO TABS: 375.0000 mg | ORAL_TABLET | Freq: Two times a day (BID) | ORAL | Status: AC

## 2015-06-28 NOTE — Discharge Instructions (Signed)
Dental Care and Dentist Visits °Dental care supports good overall health. Regular dental visits can also help you avoid dental pain, bleeding, infection, and other more serious health problems in the future. It is important to keep the mouth healthy because diseases in the teeth, gums, and other oral tissues can spread to other areas of the body. Some problems, such as diabetes, heart disease, and pre-term labor have been associated with poor oral health.  °See your dentist every 6 months. If you experience emergency problems such as a toothache or broken tooth, go to the dentist right away. If you see your dentist regularly, you may catch problems early. It is easier to be treated for problems in the early stages.  °WHAT TO EXPECT AT A DENTIST VISIT  °Your dentist will look for many common oral health problems and recommend proper treatment. At your regular dental visit, you can expect: °· Gentle cleaning of the teeth and gums. This includes scraping and polishing. This helps to remove the sticky substance around the teeth and gums (plaque). Plaque forms in the mouth shortly after eating. Over time, plaque hardens on the teeth as tartar. If tartar is not removed regularly, it can cause problems. Cleaning also helps remove stains. °· Periodic X-rays. These pictures of the teeth and supporting bone will help your dentist assess the health of your teeth. °· Periodic fluoride treatments. Fluoride is a natural mineral shown to help strengthen teeth. Fluoride treatment involves applying a fluoride gel or varnish to the teeth. It is most commonly done in children. °· Examination of the mouth, tongue, jaws, teeth, and gums to look for any oral health problems, such as: °¨ Cavities (dental caries). This is decay on the tooth caused by plaque, sugar, and acid in the mouth. It is best to catch a cavity when it is small. °¨ Inflammation of the gums caused by plaque buildup (gingivitis). °¨ Problems with the mouth or malformed  or misaligned teeth. °¨ Oral cancer or other diseases of the soft tissues or jaws.  °KEEP YOUR TEETH AND GUMS HEALTHY °For healthy teeth and gums, follow these general guidelines as well as your dentist's specific advice: °· Have your teeth professionally cleaned at the dentist every 6 months. °· Brush twice daily with a fluoride toothpaste. °· Floss your teeth daily.  °· Ask your dentist if you need fluoride supplements, treatments, or fluoride toothpaste. °· Eat a healthy diet. Reduce foods and drinks with added sugar. °· Avoid smoking. °TREATMENT FOR ORAL HEALTH PROBLEMS °If you have oral health problems, treatment varies depending on the conditions present in your teeth and gums. °· Your caregiver will most likely recommend good oral hygiene at each visit. °· For cavities, gingivitis, or other oral health disease, your caregiver will perform a procedure to treat the problem. This is typically done at a separate appointment. Sometimes your caregiver will refer you to another dental specialist for specific tooth problems or for surgery. °SEEK IMMEDIATE DENTAL CARE IF: °· You have pain, bleeding, or soreness in the gum, tooth, jaw, or mouth area. °· A permanent tooth becomes loose or separated from the gum socket. °· You experience a blow or injury to the mouth or jaw area. °  °This information is not intended to replace advice given to you by your health care provider. Make sure you discuss any questions you have with your health care provider. °  °Document Released: 04/21/2011 Document Revised: 11/01/2011 Document Reviewed: 04/21/2011 °[employer]vier Interactive Patient Education ©2016 [employer] ° °

## 2015-06-28 NOTE — ED Provider Notes (Signed)
CSN: [id number]     Arrival date & time 06/28/15  0029 History   By signing my name below, I, Murriel Hopper, attest that this documentation has been prepared under the direction and in the presence of Murl Zogg, MD. Electronically Signed: Murriel Hopper, ED Scribe. 06/28/2015. 1:43 AM.  Chief Complaint  Patient presents with  . Dental Pain     Patient is a 34 y.o. male presenting with tooth pain. The history is provided by the patient. No language interpreter was used.  Dental Pain Location:  Upper Upper teeth location:  3/RU 1st molar, 4/RU 2nd bicuspid and 5/RU 1st bicuspid Quality:  Dull Severity:  Moderate Onset quality:  Gradual Duration:  3 days Timing:  Constant Progression:  Worsening Chronicity:  New Context: dental caries and poor dentition   Context: not trauma   Previous work-up:  Dental exam Relieved by:  Nothing Ineffective treatments:  NSAIDs Associated symptoms: no congestion, no fever and no trismus    HPI Comments: Francisco Mills is a 34 y.o. male who presents to the Emergency Department complaining of constant, worsening right maxillary dental pain that has been present for three days. Pt states that he has used BC powders and peroxide on the area with no relief. Pt denies contacting or seeing a dentist prior to arrival at ED. Pt denies any other symptoms.    Past Medical History  Diagnosis Date  . Seizure   . Traumatic brain injury    No past surgical history on file. No family history on file. Social History  Substance Use Topics  . Smoking status: Current Every Day Smoker  . Smokeless tobacco: Not on file  . Alcohol Use: No    Review of Systems  Constitutional: Negative for fever.  HENT: Positive for dental problem. Negative for congestion.   All other systems reviewed and are negative.     Allergies  Review of patient's allergies indicates no known allergies.  Home Medications   Prior to Admission medications   Medication Sig Start  Date End Date Taking? Authorizing Provider  divalproex (DEPAKOTE ER) 500 MG 24 hr tablet Take 500 mg by mouth daily.   Yes Historical Provider, MD  ibuprofen (ADVIL,MOTRIN) 200 MG tablet Take 400 mg by mouth every 6 (six) hours as needed for moderate pain.   Yes Historical Provider, MD  phenytoin (DILANTIN) 100 MG ER capsule Take 3 capsules (300 mg total) by mouth 3 (three) times daily. 11/04/11 11/03/12  Earley Favor, NP   BP 150/107 mmHg  Pulse 64  Temp(Src) 98.1 F (36.7 C) (Oral)  Resp 16  SpO2 99% Physical Exam  Constitutional: He is oriented to person, place, and time. He appears well-developed and well-nourished. No distress.  HENT:  Head: Normocephalic and atraumatic.  Mouth/Throat: Oropharynx is clear and moist. No trismus in the jaw. Abnormal dentition. Dental caries present. No uvula swelling.  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 Pupils are equal, round, and reactive to light.  Neck: Normal range of motion. Neck supple.  Cardiovascular: Normal rate and regular rhythm.   Pulmonary/Chest: Effort normal and breath sounds normal. No respiratory distress. He has no wheezes. He has no rales. He exhibits no tenderness.  Abdominal: Soft. Bowel sounds are normal. There is no tenderness. There is no rebound and no guarding.  Musculoskeletal: Normal range of motion.  Lymphadenopathy:    He has  no cervical adenopathy.  Neurological: He is alert and oriented to person, place, and time.  Skin: Skin is warm and dry.  Psychiatric: He has a normal mood and affect.  Nursing note and vitals reviewed.   ED Course  Procedures (including critical care time)  DIAGNOSTIC STUDIES: Oxygen Saturation is 99% on room air, normal by my interpretation.    COORDINATION OF CARE: 1:40 AM Discussed treatment plan with pt at bedside and pt agreed to plan.   Labs  Review Labs Reviewed - No data to display  Imaging Review No results found. I have personally reviewed and evaluated these images and lab results as part of my medical decision-making.   EKG Interpretation None      MDM   Final diagnoses:  None   Naproxen and clindamycin and referral to dentist for extraction   I personally performed the services described in this documentation, which was scribed in my presence. The recorded information has been reviewed and is accurate.       Cy BlamerApril Daysi Boggan, MD 06/28/15 [id number]

## 2015-06-28 NOTE — ED Notes (Signed)
Pt complains of lower back dental pain for three days
